# Patient Record
Sex: Male | Born: 1995 | Race: White | Hispanic: No | Marital: Single | Smoking: Never smoker
Health system: Southern US, Community
[De-identification: ages and names within clinical notes are randomized; demographics above are authoritative.]

## PROBLEM LIST (undated history)

## (undated) DIAGNOSIS — K900 Celiac disease: Secondary | ICD-10-CM

---
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
## 2013-03-25 ENCOUNTER — Emergency Department (HOSPITAL_COMMUNITY)
Admission: EM | Admit: 2013-03-25 | Discharge: 2013-03-26 | Disposition: A | Discharge status: Home / Self Care | Payer: BC Managed Care – PPO | Attending: Emergency Medicine | Admitting: Emergency Medicine

## 2013-03-25 ENCOUNTER — Encounter (HOSPITAL_COMMUNITY): Payer: Self-pay | Admitting: Emergency Medicine

## 2013-03-25 DIAGNOSIS — F341 Dysthymic disorder: Secondary | ICD-10-CM

## 2013-03-25 DIAGNOSIS — IMO0002 Reserved for concepts with insufficient information to code with codable children: Secondary | ICD-10-CM | POA: Diagnosis present

## 2013-03-25 DIAGNOSIS — F1920 Other psychoactive substance dependence, uncomplicated: Secondary | ICD-10-CM

## 2013-03-25 DIAGNOSIS — F3289 Other specified depressive episodes: Secondary | ICD-10-CM | POA: Insufficient documentation

## 2013-03-25 DIAGNOSIS — F1210 Cannabis abuse, uncomplicated: Secondary | ICD-10-CM | POA: Insufficient documentation

## 2013-03-25 DIAGNOSIS — R45851 Suicidal ideations: Secondary | ICD-10-CM

## 2013-03-25 DIAGNOSIS — F418 Other specified anxiety disorders: Secondary | ICD-10-CM | POA: Diagnosis present

## 2013-03-25 DIAGNOSIS — F411 Generalized anxiety disorder: Secondary | ICD-10-CM | POA: Insufficient documentation

## 2013-03-25 DIAGNOSIS — F329 Major depressive disorder, single episode, unspecified: Secondary | ICD-10-CM | POA: Insufficient documentation

## 2013-03-25 LAB — CBC
MCH: 32.8 pg (ref 26.0–34.0)
MCHC: 35.8 g/dL (ref 30.0–36.0)
Platelets: 250 10*3/uL (ref 150–400)
RBC: 5.16 MIL/uL (ref 4.22–5.81)
RDW: 12.9 % (ref 11.5–15.5)

## 2013-03-25 LAB — RAPID URINE DRUG SCREEN, HOSP PERFORMED
Amphetamines: NOT DETECTED
Barbiturates: NOT DETECTED
Cocaine: NOT DETECTED
Opiates: NOT DETECTED
Tetrahydrocannabinol: POSITIVE — AB

## 2013-03-25 LAB — COMPREHENSIVE METABOLIC PANEL
ALT: 16 U/L (ref 0–53)
AST: 18 U/L (ref 0–37)
Alkaline Phosphatase: 84 U/L (ref 39–117)
CO2: 24 mEq/L (ref 19–32)
Calcium: 9.9 mg/dL (ref 8.4–10.5)
GFR calc Af Amer: >90 mL/min (ref >90)
Glucose, Bld: 97 mg/dL (ref 70–99)
Potassium: 3.2 mEq/L — ABNORMAL LOW (ref 3.5–5.1)
Sodium: 136 mEq/L (ref 135–145)
Total Protein: 8 g/dL (ref 6.0–8.3)

## 2013-03-25 LAB — ETHANOL: Alcohol, Ethyl (B): <11 mg/dL (ref 0–11)

## 2013-03-25 MED ORDER — ONDANSETRON HCL 4 MG PO TABS: 4.0000 mg | ORAL_TABLET | Freq: Three times a day (TID) | ORAL | Status: DC | PRN

## 2013-03-25 MED ORDER — NICOTINE 21 MG/24HR TD PT24: 21.0000 mg | MEDICATED_PATCH | Freq: Every day | TRANSDERMAL | Status: DC

## 2013-03-25 MED ORDER — ZOLPIDEM TARTRATE 5 MG PO TABS: 5.0000 mg | ORAL_TABLET | Freq: Every evening | ORAL | Status: DC | PRN

## 2013-03-25 MED ORDER — LORAZEPAM 1 MG PO TABS: 1.0000 mg | ORAL_TABLET | Freq: Three times a day (TID) | ORAL | Status: DC | PRN

## 2013-03-25 MED ORDER — IBUPROFEN 200 MG PO TABS: 600.0000 mg | ORAL_TABLET | Freq: Three times a day (TID) | ORAL | Status: DC | PRN

## 2013-03-25 MED ORDER — LORAZEPAM 1 MG PO TABS
1.0000 mg | ORAL_TABLET | Freq: Once | ORAL | Status: AC
  Administered 2013-03-25: 1 mg via ORAL
  Filled 2013-03-25: qty 1

## 2013-03-25 MED ORDER — ALUM & MAG HYDROXIDE-SIMETH 200-200-20 MG/5ML PO SUSP: 30.0000 mL | ORAL | Status: DC | PRN

## 2013-03-25 NOTE — BH Assessment (Signed)
Assessment Note  Timothy Hale is an 17 y.o. male. Pt brought to [facility] under IVC taken out by parents.  Pt's wife left him yesterday and pt had made suicidal statements.  Pt denies SI, plan or intent currently and admits to saying yesterday that he didn't want to live anymore.  Pt reports depression since January 2014.  Stressors include the substance abuse related deaths of two of his friends in April 2014.  Pt downplayed any substance abuse issues, however pt's parents, with whom pt and his wife live, report significant substance abuse issues including marijuana, alcohol, and benzodiazepines.  Parents do express concern for pt's safety and report that last night pt said he had nothing to live for and "just wanted to die."  Pt's father is pt's employer and pt's substance abuse has significantly effected his work.  Pt denies HI/AV.  Pt has recently started therapy.  No prior psych admissions.  No substance abuse treatment.  Axis I: Substance Abuse Axis II: Deferred Axis III: History reviewed. No pertinent past medical history. Axis IV: occupational problems and problems with primary support group Axis V: 51-60 moderate symptoms  Past Medical History: History reviewed. No pertinent past medical history.  History reviewed. No pertinent past surgical history.  Family History: No family history on file.  Social History:  reports that he has never smoked. He does not have any smokeless tobacco history on file. He reports that he drinks alcohol. He reports that he does not use illicit drugs.  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  Parents report significantly more alcohol use. 1 - Last Use / Amount:  11/14, 2 shots Substance #2 Name of Substance 2: marijuana 2 - Age of First Use: 12 2 - Amount (size/oz): 1/8 oz 2 - Frequency: daily 2 - Duration: 5 years 2 - Last Use / Amount: 11/16.  Pt would not say how much he used today. Substance #3 Name of Substance 3: benzodiazepines 3 - Amount (size/oz): Pt did not disclose this to ACT.  Parents report significant abuse of xanax and klonapin.  CIWA: CIWA-Ar BP: 135/94 mmHg Pulse Rate: 95 COWS:    Allergies: No Known Allergies  Home Medications:  (Not in a hospital admission)  OB/GYN Status:  No LMP for male patient.  General Assessment Data Location of Assessment: [facility] ACT Assessment: Yes Is this a Tele or Face-to-Face Assessment?: Tele Assessment Is this an Initial Assessment or a Re-assessment for this encounter?: Initial Assessment Living Arrangements: Spouse/significant other;Parent Can pt return to current living arrangement?: Yes     [facility] Crisis Care Plan Living Arrangements: Spouse/significant other;Parent Name of Psychiatrist: none Name of Therapist: [facility]  Education Status Is patient currently in school?: No  Risk to self Suicidal Ideation: No-Not Currently/Within Last 6 Months Suicidal Intent: No Is patient at risk for suicide?:  (psychiatrist will determine tomorrow) Suicidal Plan?: No Access to Means: No What has been your use of drugs/alcohol within the last 12 months?: current significant use Previous Attempts/Gestures: No Intentional Self Injurious Behavior: None Family Suicide History: Yes (2 uncles) Recent stressful life event(s): Loss (Comment) (2 friends died 10/16/12, wife left him yesterday) Persecutory voices/beliefs?: No Depression:  Yes Depression Symptoms: Tearfulness;Fatigue;Guilt;Loss of interest in usual pleasures;Feeling angry/irritable Substance abuse history and/or treatment for substance abuse?: Yes Suicide prevention information given to non-admitted  patients: Not applicable  Risk to Others Homicidal Ideation: No Thoughts of Harm to Others: No Current Homicidal Intent: No Current Homicidal Plan: No Access to Homicidal Means: No History of harm to others?: No Assessment of Violence: None Noted Does patient have access to weapons?: No Criminal Charges Pending?: No Does patient have a court date: No  Psychosis Hallucinations: None noted Delusions: None noted  Mental Status Report Appear/Hygiene: Other (Comment) (casual) Eye Contact: Good Motor Activity: Unremarkable Speech: Logical/coherent Level of Consciousness: Alert Mood: Other (Comment) (pleasant) Affect: Appropriate to circumstance Anxiety Level: Minimal Thought Processes: Coherent;Relevant Judgement: Unimpaired Orientation: Person;Place;Time;Situation Obsessive Compulsive Thoughts/Behaviors: None  Cognitive Functioning Concentration: Normal Memory: Recent Intact;Remote Intact IQ: Average Insight: Good Impulse Control: Poor Appetite: Fair Weight Loss:  (yes-not sure how much) Weight Gain: 0 Sleep: No Change Total Hours of Sleep: 7 Vegetative Symptoms: None  ADLScreening [facility] Assessment Services) Patient's cognitive ability adequate to safely complete daily activities?: Yes Patient able to express need for assistance with ADLs?: Yes Independently performs ADLs?: Yes (appropriate for developmental age)  Prior Inpatient Therapy Prior Inpatient Therapy: No  Prior Outpatient Therapy Prior Outpatient Therapy: Yes Prior Therapy Dates: current Prior Therapy Facilty/Provider(s): [facility] Reason for Treatment: psych  ADL Screening (condition at time of admission) Patient's cognitive ability adequate to safely complete daily activities?: Yes Patient able to express need for assistance with ADLs?: Yes Independently performs ADLs?: Yes (appropriate for developmental age)       Abuse/Neglect Assessment (Assessment to be  complete while patient is alone) Physical Abuse: Denies Verbal Abuse: Denies Sexual Abuse: Denies Exploitation of patient/patient's resources: Denies Self-Neglect: Denies Values / Beliefs Cultural Requests During Hospitalization: None Spiritual Requests During Hospitalization: None   Advance Directives (For Healthcare) Advance Directive: Patient does not have advance directive;Patient would not like information    Additional Information 1:1 In Past 12 Months?: No CIRT Risk: No Elopement Risk: No Does patient have medical clearance?: Yes     Disposition: Discussed this pt with Maryjean Morn, PA, and plan is for pt to be evaluated by psychiatrist in AM to determine if IVC should be terminated.  ACT informed Dr. Radford Pax of [facility] of this plan and he will transfer pt to psych ed.  Disposition Initial Assessment Completed for this Encounter: Yes Disposition of Patient:  (psychiatrist will determine IVC)  On Site Evaluation by:   Reviewed with Physician:    Lorri Frederick 03/25/2013 9:04 PM

## 2013-03-25 NOTE — ED Notes (Signed)
Patient presents depressed, calm and cooperative; denies currents thoughts of self harm or thoughts to harm others; no AVH, no pain, NAD. Patient states that his wife told him that she had an affair with one of his groomsman; admits to depression and feelings of hopelessness.

## 2013-03-25 NOTE — Consult Note (Signed)
[facility] Face-to-Face Psychiatry Consult   Reason for Consult:  ED Referral on IVC by parents Referring Physician:  ED Providers Timothy Hale is an 17 y.o. male.  Assessment: AXIS I:  Substance Abuse ;THC Dependence; Depression with anxiety;Suicidal thought; AXIS II:  Deferred AXIS III:  History reviewed. No pertinent past medical history. AXIS IV:  problems with primary support group AXIS V:  41-50 serious symptoms  Plan:  Suicide risk assessment in AM by Psych MD  Subjective:   Timothy Hale is a 17 y.o. male patient admitted with IVC papers from parents who were concerned over pts drug abuse;poor work pwerformance (works with fathet) and seperation from wife.Pt expressed passive feelings of being better off dead and thoughts of suicide in the context of his depression and substance abuse without ideation.intent or plan.He has been under care of his FMD on Pristiq for 2 months and sees a counselor who has offerd him psychiatric referral..  HPI:  As above.Pt reports he ran out of xanax so he bought" 2- 5 mg Klonopin"his xanax rx is 1mg  # 30 for 30 days.He haqs been smoking marijuana heavily and daily for 3 years. HPI Elements:   Context:  As above.  Past Psychiatric History: History reviewed. No pertinent past medical history.  reports that he has never smoked. He does not have any smokeless tobacco history on file. He reports that he drinks alcohol. He reports that he does not use illicit drugs. No family history on file.       Abuse/Neglect [facility]) Physical Abuse: Denies Verbal Abuse: Denies Sexual Abuse: Denies Allergies:  No Known Allergies  ACT Assessment Complete:  Yes:    Educational Status    Risk to Self: Risk to self Suicidal Ideation: No-Not Currently/Within Last 6 Months Suicidal Intent: No Is patient at risk for suicide?:  (psychiatrist will determine tomorrow) Suicidal Plan?: No Access to Means: No What has been your use of drugs/alcohol within the last 12  months?: current significant use Previous Attempts/Gestures: No Intentional Self Injurious Behavior: None Family Suicide History: Yes (2 uncles) Recent stressful life event(s): Loss (Comment) (2 friends died Sep 17, 2012, wife left him yesterday) Persecutory voices/beliefs?: No Depression: Yes Depression Symptoms: Tearfulness;Fatigue;Guilt;Loss of interest in usual pleasures;Feeling angry/irritable Substance abuse history and/or treatment for substance abuse?: Yes Suicide prevention information given to non-admitted patients: Not applicable  Risk to Others: Risk to Others Homicidal Ideation: No Thoughts of Harm to Others: No Current Homicidal Intent: No Current Homicidal Plan: No Access to Homicidal Means: No History of harm to others?: No Assessment of Violence: None Noted Does patient have access to weapons?: No Criminal Charges Pending?: No Does patient have a court date: No  Abuse: Abuse/Neglect Assessment (Assessment to be complete while patient is alone) Physical Abuse: Denies Verbal Abuse: Denies Sexual Abuse: Denies Exploitation of patient/patient's resources: Denies Self-Neglect: Denies  Prior Inpatient Therapy: Prior Inpatient Therapy Prior Inpatient Therapy: No  Prior Outpatient Therapy: Prior Outpatient Therapy Prior Outpatient Therapy: Yes Prior Therapy Dates: current Prior Therapy Facilty/Provider(s): [facility] Reason for Treatment: psych  Additional Information: Additional Information 1:1 In Past 12 Months?: No CIRT Risk: No Elopement Risk: No Does patient have medical clearance?: Yes                  Objective: Blood pressure 135/94, pulse 95, temperature 98.4 F (36.9 C), temperature source Oral, resp. rate 16, SpO2 98.00%.There is no height or weight on file to calculate BMI. Results for orders placed during  the hospital encounter of 03/25/13 (from the past 72 hour(s))  URINE RAPID DRUG SCREEN (HOSP PERFORMED)      Status: Abnormal   Collection Time    03/25/13  7:27 PM      Result Value Range   Opiates NONE DETECTED  NONE DETECTED   Cocaine NONE DETECTED  NONE DETECTED   Benzodiazepines POSITIVE (*) NONE DETECTED   Amphetamines NONE DETECTED  NONE DETECTED   Tetrahydrocannabinol POSITIVE (*) NONE DETECTED   Barbiturates NONE DETECTED  NONE DETECTED   Comment:            DRUG SCREEN FOR MEDICAL PURPOSES     ONLY.  IF CONFIRMATION IS NEEDED     FOR ANY PURPOSE, NOTIFY LAB     WITHIN 5 DAYS.                LOWEST DETECTABLE LIMITS     FOR URINE DRUG SCREEN     Drug Class       Cutoff (ng/mL)     Amphetamine      1000     Barbiturate      200     Benzodiazepine   200     Tricyclics       300     Opiates          300     Cocaine          300     THC              50  ACETAMINOPHEN LEVEL     Status: None   Collection Time    03/25/13  7:53 PM      Result Value Range   Acetaminophen (Tylenol), Serum 15.0  10 - 30 ug/mL   Comment:            THERAPEUTIC CONCENTRATIONS VARY     SIGNIFICANTLY. A RANGE OF 10-30     ug/mL MAY BE AN EFFECTIVE     CONCENTRATION FOR MANY PATIENTS.     HOWEVER, SOME ARE BEST TREATED     AT CONCENTRATIONS OUTSIDE THIS     RANGE.     ACETAMINOPHEN CONCENTRATIONS     >150 ug/mL AT 4 HOURS AFTER     INGESTION AND >50 ug/mL AT 12     HOURS AFTER INGESTION ARE     OFTEN ASSOCIATED WITH TOXIC     REACTIONS.  CBC     Status: Abnormal   Collection Time    03/25/13  7:53 PM      Result Value Range   WBC 10.9 (*) 4.0 - 10.5 K/uL   RBC 5.16  4.22 - 5.81 MIL/uL   Hemoglobin 16.9  13.0 - 17.0 g/dL   HCT 16.1  09.6 - 04.5 %   MCV 91.5  78.0 - 100.0 fL   MCH 32.8  26.0 - 34.0 pg   MCHC 35.8  30.0 - 36.0 g/dL   RDW 40.9  81.1 - 91.4 %   Platelets 250  150 - 400 K/uL  COMPREHENSIVE METABOLIC PANEL     Status: Abnormal   Collection Time    03/25/13  7:53 PM      Result Value Range   Sodium 136  135 - 145 mEq/L   Potassium 3.2 (*) 3.5 - 5.1 mEq/L   Chloride 100  96  - 112 mEq/L   CO2 24  19 - 32 mEq/L   Glucose, Bld 97  70 - 99 mg/dL  BUN 15  6 - 23 mg/dL   Creatinine, Ser 1.61  0.50 - 1.35 mg/dL   Calcium 9.9  8.4 - 09.6 mg/dL   Total Protein 8.0  6.0 - 8.3 g/dL   Albumin 4.8  3.5 - 5.2 g/dL   AST 18  0 - 37 U/L   ALT 16  0 - 53 U/L   Alkaline Phosphatase 84  39 - 117 U/L   Total Bilirubin 0.4  0.3 - 1.2 mg/dL   GFR calc non Af Amer >90  >90 mL/min   GFR calc Af Amer >90  >90 mL/min   Comment: (NOTE)     The eGFR has been calculated using the CKD EPI equation.     This calculation has not been validated in all clinical situations.     eGFR's persistently <90 mL/min signify possible Chronic Kidney     Disease.  ETHANOL     Status: None   Collection Time    03/25/13  7:53 PM      Result Value Range   Alcohol, Ethyl (B) <11  0 - 11 mg/dL   Comment:            LOWEST DETECTABLE LIMIT FOR     SERUM ALCOHOL IS 11 mg/dL     FOR MEDICAL PURPOSES ONLY  SALICYLATE LEVEL     Status: Abnormal   Collection Time    03/25/13  7:53 PM      Result Value Range   Salicylate Lvl <2.0 (*) 2.8 - 20.0 mg/dL   Labs are reviewed and are pertinent for UDS + Benzo and THC..  Current Facility-Administered Medications  Medication Dose Route Frequency Provider Last Rate Last Dose  . LORazepam (ATIVAN) tablet 1 mg  1 mg Oral Once Roxy Horseman, PA-C       Current Outpatient Prescriptions  Medication Sig Dispense Refill  . ALPRAZolam (XANAX) 1 MG tablet Take 1 tablet by mouth daily.      Marland Kitchen PRISTIQ 50 MG 24 hr tablet Take 1 tablet by mouth daily.        Psychiatric Specialty Exam:     Blood pressure 135/94, pulse 95, temperature 98.4 F (36.9 C), temperature source Oral, resp. rate 16, SpO2 98.00%.There is no height or weight on file to calculate BMI.  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  without intent/plan fleeting and in the context of his substance abuse and problema at work and at home  Homicidal Thoughts:  No  Memory:  NA  Judgement:  Poor  Insight:  Lacking especially around his THC history  Psychomotor Activity:  Normal  Concentration:  Good  Recall:  Good  Akathisia:  NA  Handed:  Right  AIMS (if indicated):     Assets:  Social Support  Sleep:      Treatment Plan Summary: Pt to remain overnite for observation and undergo Suicide Risk Assessment  in AM. He plans to call his therapist for appt tomorrow and referrall to psychiatrist  Court Joy 03/25/2013 8:48 PM

## 2013-03-25 NOTE — ED Provider Notes (Signed)
Medical screening examination/treatment/procedure(s) were performed by non-physician practitioner and as supervising physician I was immediately available for consultation/collaboration.    Nelia Shi, MD 03/25/13 2051

## 2013-03-25 NOTE — ED Provider Notes (Signed)
CSN: [id number]     Arrival date & time 03/25/13  1831 History  This chart was scribed for non-physician practitioner, Roxy Horseman, PA-C,working with Candyce Churn, MD, by Karle Plumber, ED Scribe.  This patient was seen in room [facility] and the patient's care was started at 6:48 PM.  Chief Complaint  Patient presents with  . Psychiatric Evaluation   The history is provided by the patient. No language interpreter was used.   HPI Comments:  Timothy Hale is a 17 y.o. male brought in by [facility] Dept who presents to the Emergency Department complaining of IVC. Pt states he is not a threat to himself or others and does not have a desire to hurt himself or anyone else. He states he has been struggling with depression, but has no desire to hurt himself because he is a [place] and believes he will go to [place] if he commits suicide. He states his two best friends died in 2022-09-14, his wife cheated on him and he has several issues as such that he has not been able to deal with properly. He states he has been alone for sometime with no outlet. He states his antidepressants have been affecting him sexually, making his wife feel like he does not find her attractive or love her anymore. Pt states his medication was changed to Prestique, which helped some, but he still experiences the lack of sex drive. He states he has a hard time performing simple tasks at work causing problems with his job. He expresses a desire to have his medications changed so that he can get some sort of relief from the depression. He denies any knowledge of any medical problems. He denies any pain. Pt states he feels somewhat anxious right now because he does not know what is going to happen. He reports recent use of marijuana.   History reviewed. No pertinent past medical history. History reviewed. No pertinent past surgical history. No family history on file. History  Substance Use Topics  . Smoking  status: Never Smoker   . Smokeless tobacco: Not on file  . Alcohol Use: Yes    Review of Systems  Allergies  Review of patient's allergies indicates not on file.  Home Medications  No current outpatient prescriptions on file. BP 135/94  Pulse 125  Temp(Src) 98.4 F (36.9 C) (Oral)  Resp 16  SpO2 98% Physical Exam  Nursing note and vitals reviewed. Constitutional: He is oriented to person, place, and time. He appears well-developed and well-nourished. No distress.  HENT:  Head: Normocephalic and atraumatic.  Eyes: Conjunctivae are normal. No scleral icterus.  Neck: Neck supple.  Cardiovascular: Regular rhythm, normal heart sounds and intact distal pulses.   Tachycardic  Pulmonary/Chest: Effort normal and breath sounds normal. No stridor. No respiratory distress. He has no wheezes. He has no rales.  Abdominal: Normal appearance. He exhibits no distension.  Musculoskeletal: Normal range of motion.  Neurological: He is alert and oriented to person, place, and time.  Skin: Skin is warm and dry. No rash noted.  Psychiatric: He has a normal mood and affect. His behavior is normal.    ED Course  Procedures (including critical care time) DIAGNOSTIC STUDIES: Oxygen Saturation is 98% on RA, normal by my interpretation.   COORDINATION OF CARE: 7:00 PM- Will have psych and social work evaluate pt as necessary. Pt verbalizes understanding and agrees to plan.  Medications  LORazepam (ATIVAN) tablet 1 mg (not administered)    Labs Review  Labs Reviewed  ACETAMINOPHEN LEVEL  CBC  COMPREHENSIVE METABOLIC PANEL  ETHANOL  SALICYLATE LEVEL  URINE RAPID DRUG SCREEN (HOSP PERFORMED)   Imaging Review No results found.  EKG Interpretation   None       MDM  No diagnosis found.  With patient under IVC. Severe depression, many life stressors. Will discuss the patient with TTS.  Psych hold orders.  Patient discussed with Dr. Unknown Foley, will give a dose of Ativan, and recheck  heart rate. If HR remains elevated, will keep the patient on the acute side until stabilized.  7:32 PM Filed Vitals:   03/25/13 1931  BP:   Pulse: 95  Temp:   Resp:      Plan:   -TTS consult pending -Labs pending  Patient signed out to Dr. Radford Pax and Tiburcio Pea, PA-C, who will follow-up on labs and continue care.  I personally performed the services described in this documentation, which was scribed in my presence. The recorded information has been reviewed and is accurate.     Roxy Horseman, PA-C 03/25/13 1944

## 2013-03-25 NOTE — ED Notes (Signed)
Pt family took out IVC papers sts that pt wanted to harm himself.

## 2013-03-25 NOTE — ED Notes (Signed)
Psych assessment completed.  Report called to psych holding.  Pt to transfer to holding once paperwork completed.

## 2013-03-26 NOTE — ED Notes (Signed)
Pt is awake and alert, pleasant and cooperative. Patient denies HI, SI AH or VH. Discharge vitals 127/79 HR 88 RR 16 and unlabored.. Will continue to monitor for safety. Patient escorted to lobby without incident. T.Melvyn Neth RN

## 2013-03-26 NOTE — [facility] Suicide Risk Assessment (Signed)
Suicide Risk Assessment  Discharge Assessment     Demographic Factors:  Male and Adolescent or young adult  Mental Status Per Nursing Assessment::   On Admission:     Current Mental Status by Physician: Psychiatric Specialty Exam: Physical Exam  ROS  Blood pressure 119/66, pulse 66, temperature 97.8 F (36.6 C), temperature source Oral, resp. rate 16, SpO2 98.00%.There is no height or weight on file to calculate BMI.  General Appearance: Fairly Groomed  Patent attorney::  Fair  Speech:  Clear and Coherent  Volume:  Normal  Mood:  Depressed  Affect:  Congruent  Thought Process:  Goal Directed  Orientation:  Full (Time, Place, and Person)  Thought Content:  Hallucinations: None  Suicidal Thoughts:  No  Homicidal Thoughts:  No  Memory:  Negative  Judgement:  Fair  Insight:  Fair  Psychomotor Activity:  Normal  Concentration:  Good  Recall:  Good  Akathisia:  Negative  Handed:  Right  AIMS (if indicated):     Assets:  Communication Skills Desire for Improvement Housing Social Support  Sleep:       Loss Factors: Loss of significant relationship  Historical Factors: NA  Risk Reduction Factors:   Sense of responsibility to family, Religious beliefs about death, Employed, Living with another person, especially a relative and Positive social support  Continued Clinical Symptoms:  depression  Cognitive Features That Contribute To Risk:  Polarized thinking    Suicide Risk:  Minimal: No identifiable suicidal ideation.  Patients presenting with no risk factors but with morbid ruminations; may be classified as minimal risk based on the severity of the depressive symptoms  Discharge Diagnoses:   AXIS I:  Depressive Disorder NOS AXIS II:  Deferred AXIS III:  History reviewed. No pertinent past medical history. AXIS IV:  other psychosocial or environmental problems AXIS V:  61-70 mild symptoms  Plan Of Care/Follow-up recommendations:  Other:  f/u with oupatient  psychiatrist and therapist  Is patient on multiple antipsychotic therapies at discharge:  No   Has Patient had three or more failed trials of antipsychotic monotherapy by history:  No  Recommended Plan for Multiple Antipsychotic Therapies: NA  Louie Meaders 03/26/2013, 2:53 PM

## 2013-03-26 NOTE — ED Provider Notes (Signed)
Pt seen by myself and psych and cleared for d/c.   Toy Baker, MD 03/26/13 [id number]

## 2013-03-26 NOTE — Progress Notes (Signed)
Patient ID: Timothy Hale, male   DOB: Sep 23, 1995, 17 y.o.   MRN: [id number]  Pt was seen with NP. Initial consult note reviewed, and med records reviewed. Pt reports being depressed d/t wife cheating on him. He states that he would never hurt himself, because "I am Christian, and I would not want to go to hell by killing myself". He denies HI/AVH. Sleep/appt good. He drinks 3 beers once a week, and uses marijuana daily. He started taking pristiq 50 mg daily 6 weeks ago, and has sexual side effects. Pt denies current SI/HI/AVH.  See suicide risk assessment for [facility] on discharge.  Plan: I called dad for collateral info. Dad agrees to lock up all pills at home. Pt does not have access to weapons or firearms. Dad denies that pt has any h/o suicide attempts. Pt lives with dad. Pt had passive death wishes yesterday ("nothing to live for"), but did not have plan/intent to hurt himself. Dad feels comfortable with pt being discharged to home today. Dad feels that pt "learned his lesson". Dad will make sure that pt keeps f/u appointments. Will discharge pt to home today, and f/u with outpatient psychiatrist/therapist. Recommended substance abuse counseling as well.  IVC was released.  Ancil Linsey, MD

## 2013-03-29 NOTE — Consult Note (Signed)
Agree with plan 

## 2017-07-28 ENCOUNTER — Emergency Department (HOSPITAL_BASED_OUTPATIENT_CLINIC_OR_DEPARTMENT_OTHER)
Admission: EM | Admit: 2017-07-28 | Discharge: 2017-07-28 | Disposition: A | Discharge status: Home / Self Care | Payer: BLUE CROSS/BLUE SHIELD | Attending: Emergency Medicine | Admitting: Emergency Medicine

## 2017-07-28 ENCOUNTER — Emergency Department (HOSPITAL_BASED_OUTPATIENT_CLINIC_OR_DEPARTMENT_OTHER): Payer: BLUE CROSS/BLUE SHIELD

## 2017-07-28 ENCOUNTER — Encounter (HOSPITAL_BASED_OUTPATIENT_CLINIC_OR_DEPARTMENT_OTHER): Payer: Self-pay

## 2017-07-28 DIAGNOSIS — Z789 Other specified health status: Secondary | ICD-10-CM

## 2017-07-28 DIAGNOSIS — I10 Essential (primary) hypertension: Secondary | ICD-10-CM | POA: Diagnosis not present

## 2017-07-28 DIAGNOSIS — Z79899 Other long term (current) drug therapy: Secondary | ICD-10-CM | POA: Insufficient documentation

## 2017-07-28 DIAGNOSIS — Z720 Tobacco use: Secondary | ICD-10-CM | POA: Insufficient documentation

## 2017-07-28 DIAGNOSIS — K760 Fatty (change of) liver, not elsewhere classified: Secondary | ICD-10-CM | POA: Diagnosis present

## 2017-07-28 DIAGNOSIS — R197 Diarrhea, unspecified: Secondary | ICD-10-CM | POA: Insufficient documentation

## 2017-07-28 DIAGNOSIS — F1010 Alcohol abuse, uncomplicated: Secondary | ICD-10-CM | POA: Insufficient documentation

## 2017-07-28 DIAGNOSIS — R112 Nausea with vomiting, unspecified: Secondary | ICD-10-CM | POA: Insufficient documentation

## 2017-07-28 DIAGNOSIS — Z7289 Other problems related to lifestyle: Secondary | ICD-10-CM

## 2017-07-28 HISTORY — DX: Celiac disease: K90.0

## 2017-07-28 LAB — COMPREHENSIVE METABOLIC PANEL
ALT: 55 U/L (ref 17–63)
AST: 40 U/L (ref 15–41)
Albumin: 4.8 g/dL (ref 3.5–5.0)
Alkaline Phosphatase: 83 U/L (ref 38–126)
Anion gap: 12 (ref 5–15)
BILIRUBIN TOTAL: 0.5 mg/dL (ref 0.3–1.2)
BUN: 16 mg/dL (ref 6–20)
CO2: 21 mmol/L — ABNORMAL LOW (ref 22–32)
CREATININE: 0.9 mg/dL (ref 0.61–1.24)
Calcium: 9.5 mg/dL (ref 8.9–10.3)
Chloride: 105 mmol/L (ref 101–111)
Glucose, Bld: 110 mg/dL — ABNORMAL HIGH (ref 65–99)
POTASSIUM: 4 mmol/L (ref 3.5–5.1)
Sodium: 138 mmol/L (ref 135–145)
TOTAL PROTEIN: 7.7 g/dL (ref 6.5–8.1)

## 2017-07-28 LAB — CBC WITH DIFFERENTIAL/PLATELET
BASOS ABS: 0 10*3/uL (ref 0.0–0.1)
BASOS PCT: 0 %
EOS PCT: 1 %
Eosinophils Absolute: 0.1 10*3/uL (ref 0.0–0.7)
HCT: 46 % (ref 39.0–52.0)
Hemoglobin: 16.4 g/dL (ref 13.0–17.0)
Lymphocytes Relative: 18 %
Lymphs Abs: 1.3 10*3/uL (ref 0.7–4.0)
MCH: 33.9 pg (ref 26.0–34.0)
MCHC: 35.7 g/dL (ref 30.0–36.0)
MCV: 95 fL (ref 78.0–100.0)
Monocytes Absolute: 0.8 10*3/uL (ref 0.1–1.0)
Monocytes Relative: 10 %
Neutro Abs: 5.3 10*3/uL (ref 1.7–7.7)
Neutrophils Relative %: 71 %
PLATELETS: 220 10*3/uL (ref 150–400)
RBC: 4.84 MIL/uL (ref 4.22–5.81)
RDW: 12.7 % (ref 11.5–15.5)
WBC: 7.5 10*3/uL (ref 4.0–10.5)

## 2017-07-28 LAB — URINALYSIS, ROUTINE W REFLEX MICROSCOPIC
Bilirubin Urine: NEGATIVE
Glucose, UA: NEGATIVE mg/dL
Hgb urine dipstick: NEGATIVE
KETONES UR: 15 mg/dL — AB
Leukocytes, UA: NEGATIVE
NITRITE: NEGATIVE
PROTEIN: NEGATIVE mg/dL
Specific Gravity, Urine: 1.015 (ref 1.005–1.030)
pH: 7 (ref 5.0–8.0)

## 2017-07-28 LAB — LIPASE, BLOOD: LIPASE: 31 U/L (ref 11–51)

## 2017-07-28 MED ORDER — ONDANSETRON 4 MG PO TBDP: 4.0000 mg | ORAL_TABLET | Freq: Three times a day (TID) | ORAL | 0 refills | Status: AC | PRN

## 2017-07-28 MED ORDER — ONDANSETRON HCL 4 MG/2ML IJ SOLN
4.0000 mg | Freq: Once | INTRAMUSCULAR | Status: AC
Start: 2017-07-28 — End: 2017-07-28
  Administered 2017-07-28: 4 mg via INTRAVENOUS
  Filled 2017-07-28: qty 2

## 2017-07-28 MED ORDER — IOPAMIDOL (ISOVUE-300) INJECTION 61%
100.0000 mL | Freq: Once | INTRAVENOUS | Status: AC | PRN
  Administered 2017-07-28: 100 mL via INTRAVENOUS

## 2017-07-28 MED ORDER — SODIUM CHLORIDE 0.9 % IV BOLUS (SEPSIS)
1000.0000 mL | Freq: Once | INTRAVENOUS | Status: AC
  Administered 2017-07-28: 1000 mL via INTRAVENOUS

## 2017-07-28 NOTE — Discharge Instructions (Addendum)
Please avoid tylenol as this can harm your liver.  You need to slowly cut back on the amount of alcohol you drink.  Your liver changes are most likely representing an early sign of alcohol damage to your body.    Please stop smoking.  Your blood pressure was high today and you need to get it re-checked in 3 weeks.

## 2017-07-28 NOTE — ED Triage Notes (Addendum)
Pt reports emesis with blood starting this morning; has vomited 4 times this morning with blood in it twice; states he has lower abd pain. Hx of celiac disease. Denies any blood in BM this morning.

## 2017-07-28 NOTE — ED Notes (Signed)
Family at bedside. 

## 2017-07-28 NOTE — ED Provider Notes (Signed)
Patient vomited 4 times onset this morning.  He reports first episode had slight amount of blood in it second 2 episodes were yellowish material mixed with blood and the fourth episode was blood-tinged.  He also had 3 episodes of nonbloody diarrhea this morning.  He drinks 7-10 shots of liquor per day.  He is presently asymptomatic except for mild abdominal discomfort.  On exam alert no distress lungs clear to auscultation heart regular rate and rhythm abdomen nondistended nontender.  Neurologic Glasgow Coma Score 15 not tremulous cranial nerves II through XII grossly intact moves all extremities well.  Not lightheaded on standing.  I had a lengthy conversation with patient about his need to stop drinking given fatty liver on CT scan.  He will be referred to resource guide for substance abuse, I counseled patient for 5 minutes on smoking cessation he is advised to get his blood pressure rechecked in 3 weeks   Timothy Hale, Timothy Yohe, MD 07/28/17 1253

## 2017-07-28 NOTE — ED Provider Notes (Signed)
[facility] EMERGENCY DEPARTMENT Provider Note   CSN: [id number] Arrival date & time: 07/28/17  0906     History   Chief Complaint Chief Complaint  Patient presents with  . Emesis    HPI Timothy Hale is a 22 y.o. male with a history of celiac disease who presents today for evaluation of N/V/D and lower abdominal pain.  He reports that his pain and symptoms started around 4am.  No one else in the house has similar symptoms.  The reports that he has vomited twice this morning with multiple episodes of diarrhea.  Twice when he vomited it was read consistent with blood.  He denies any dark tarry sticky bowel movements.  He does report that he drinks about 10 shots a day.  He denies any problems when he stops drinking.  He denies excessive use of Tylenol, NSAIDs.  He reports that when he was about 17 he had a endoscopy where he had a 1.5 cm noncancerous mass removed.  He was supposed to have GI follow up in 3 years with repeat study but has not followed up with GI.    HPI  Past Medical History:  Diagnosis Date  . Celiac disease     Patient Active Problem List   Diagnosis Date Noted  . Fatty liver 07/28/2017  . Substance abuse/dependence 03/25/2013  . 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 Suicidal thoughts 03/25/2013    History reviewed. No pertinent surgical history.     Home Medications    Prior to Admission medications   Medication Sig Start Date End Date Taking? Authorizing Provider  ALPRAZolam Prudy Feeler) 1 MG tablet Take 1 tablet by mouth daily. 02/19/13   [provider]  ondansetron (ZOFRAN ODT) 4 MG disintegrating tablet Take 1 tablet (4 mg total) by mouth every 8 (eight) hours as needed for nausea or vomiting. 07/28/17   Cristina Gong, PA-C  PRISTIQ 50 MG 24 hr tablet Take 1 tablet by mouth daily. 02/28/13   [provider]    Family History No family history on file.  Social History Social History   Tobacco Use  . Smoking status:  Never Smoker  Substance Use Topics  . Alcohol use: Yes  . Drug use: No     Allergies   Gluten meal   Review of Systems Review of Systems  Constitutional: Negative for chills and fever.  Respiratory: Negative for cough and shortness of breath.   Gastrointestinal: Positive for abdominal pain, diarrhea, nausea and vomiting. Negative for abdominal distention and blood in stool.  Genitourinary: Negative for dysuria.  Musculoskeletal: Negative for neck pain and neck stiffness.  Skin: Negative for rash.  Neurological: Negative for dizziness and light-headedness.  All other systems reviewed and are negative.    Physical Exam Updated Vital Signs BP (!) 144/77 (BP Location: Left Arm)   Pulse 79   Temp 98.6 F (37 C) (Oral)   Resp 18   Ht 6\' 2"  (1.88 m)   Wt 89.8 kg (198 lb)   SpO2 100%   BMI 25.42 kg/m   Physical Exam  Constitutional: He is oriented to person, place, and time. He appears well-developed and well-nourished.  HENT:  Head: Normocephalic and atraumatic.  Mouth/Throat: Oropharynx is clear and moist.  Eyes: Conjunctivae are normal.  Neck: Normal range of motion. Neck supple.  Cardiovascular: Normal rate and regular rhythm.  No murmur heard. Pulmonary/Chest: Effort normal and breath sounds normal. No respiratory distress.  Abdominal: Soft. Normal appearance. He exhibits no  distension and no mass. Bowel sounds are increased. There is tenderness (diffusely). There is no guarding.  Musculoskeletal: He exhibits no edema.  Neurological: He is alert and oriented to person, place, and time.  Skin: Skin is warm and dry. He is not diaphoretic.  Psychiatric: He has a normal mood and affect.  Nursing note and vitals reviewed.    ED Treatments / Results  Labs (all labs ordered are listed, but only abnormal results are displayed) Labs Reviewed  COMPREHENSIVE METABOLIC PANEL - Abnormal; Notable for the following components:      Result Value   CO2 21 (*)    Glucose,  Bld 110 (*)    All other components within normal limits  URINALYSIS, ROUTINE W REFLEX MICROSCOPIC - Abnormal; Notable for the following components:   Ketones, ur 15 (*)    All other components within normal limits  LIPASE, BLOOD  CBC WITH DIFFERENTIAL/PLATELET    EKG  EKG Interpretation None       Radiology Ct Abdomen Pelvis W Contrast  Result Date: 07/28/2017 CLINICAL DATA:  Lower abdominal pain with nausea and vomiting EXAM: CT ABDOMEN AND PELVIS WITH CONTRAST TECHNIQUE: Multidetector CT imaging of the abdomen and pelvis was performed using the standard protocol following bolus administration of intravenous contrast. CONTRAST:  100mL ISOVUE-300 COMPARISON:  None. FINDINGS: Lower chest: No acute abnormality. Hepatobiliary: Diffuse fatty infiltration of the liver is noted. The gallbladder is decompressed. Pancreas: Unremarkable. No pancreatic ductal dilatation or surrounding inflammatory changes. Spleen: Normal in size without focal abnormality. Adrenals/Urinary Tract: Adrenal glands are unremarkable. Kidneys are normal, without renal calculi, focal lesion, or hydronephrosis. Bladder is unremarkable. Stomach/Bowel: Stomach is within normal limits. Appendix appears normal. No evidence of bowel wall thickening, distention, or inflammatory changes. Vascular/Lymphatic: No significant vascular findings are present. No enlarged abdominal or pelvic lymph nodes. Reproductive: Prostate is unremarkable. Other: No abdominal wall hernia or abnormality. No abdominopelvic ascites. Musculoskeletal: No acute or significant osseous findings. IMPRESSION: Fatty liver. No acute abnormality is identified. Electronically Signed   By: Alcide CleverMark  Lukens M.D.   On: 07/28/2017 12:11    Procedures Procedures (including critical care time)  Medications Ordered in ED Medications  sodium chloride 0.9 % bolus 1,000 mL (0 mLs Intravenous Stopped 07/28/17 1204)  iopamidol (ISOVUE-300) 61 % injection 100 mL (100 mLs  Intravenous Contrast Given 07/28/17 1126)  ondansetron (ZOFRAN) injection 4 mg (4 mg Intravenous Given 07/28/17 1312)     Initial Impression / Assessment and Plan / ED Course  I have reviewed the triage vital signs and the nursing notes.  Pertinent labs & imaging results that were available during my care of the patient were reviewed by me and considered in my medical decision making (see chart for details).     Patient with symptoms consistent with viral gastroenteritis.  Vitals are stable, no fever.  No signs of dehydration, tolerating PO fluids > 6 oz.  Lungs are clear.  No focal abdominal pain.  Given history of drinking and having a 1.5cm mass removed and not following up with GI labs were obtained and reviewed, appear reassuring.  CT abdomen pelvis was obtained given history of mass, and vomit with blood.  CT scan showed fatty liver with out cause for his symptoms.  We discussed the need to stop drinking and that he needs to slowly wean him self down as I suspect he may go into withdrawal if he stopped cold [place].  GI referral given along with outpatient substance abuse guide.  Supportive therapy indicated with return if symptoms worsen.  Patient counseled.   Final Clinical Impressions(s) / ED Diagnoses   Final diagnoses:  Nausea vomiting and diarrhea  Fatty liver  Hypertension, unspecified type  Alcohol use  Tobacco use    ED Discharge Orders        Ordered    ondansetron (ZOFRAN ODT) 4 MG disintegrating tablet  Every 8 hours PRN     07/28/17 1358       Norman Clay 07/28/17 2140    Doug Sou, MD 07/29/17 [phone]

## 2017-07-28 NOTE — ED Notes (Signed)
Patient transported to CT 

## 2019-05-20 IMAGING — CT CT ABD-PELV W/ CM
2 of 4 series · 16 of 46 positions shown, 18 images · IV contrast (agent unspecified)
Comparison: None.

CLINICAL DATA: Lower abdominal pain with nausea and vomiting

EXAM:
CT ABDOMEN AND PELVIS WITH CONTRAST
TECHNIQUE: Multidetector CT imaging of the abdomen and pelvis was performed
using the standard protocol following bolus administration of
intravenous contrast.
CONTRAST:  100mL [id number]

[Series 2: axial st · axial · 0.89mm/px · z∈[-544,-68]mm · 13 of 105 slices shown, 15 images]
[im 5/105  soft-tissue]
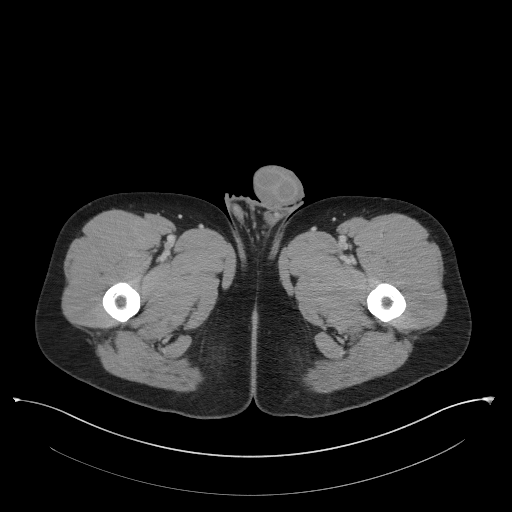
[im 5/105  bone]
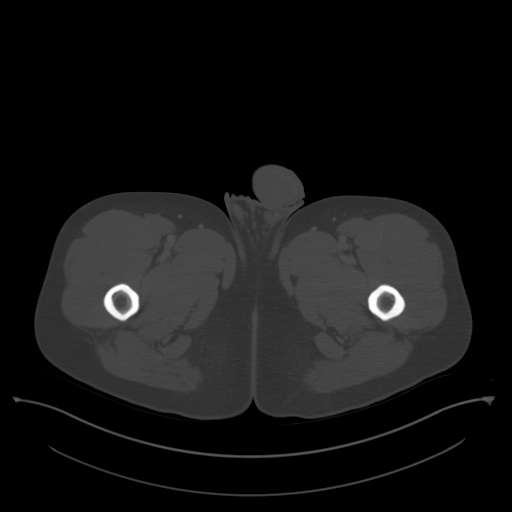
[im 14/105  soft-tissue]
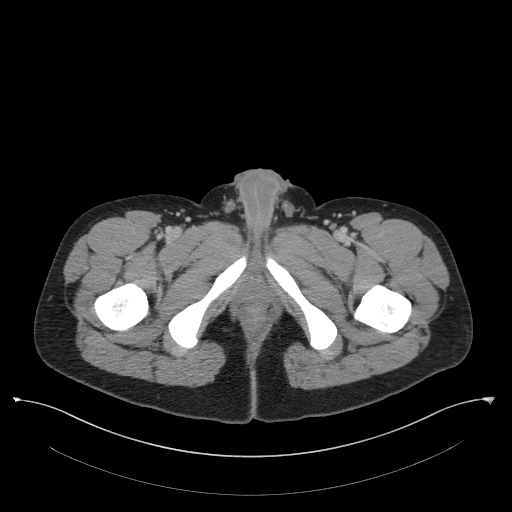
[im 22/105  soft-tissue]
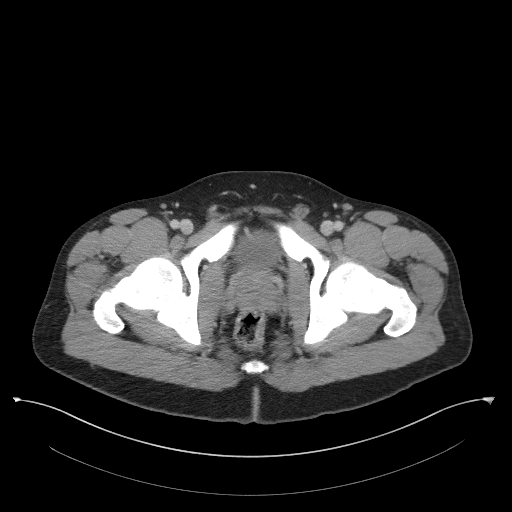
[im 31/105  soft-tissue]
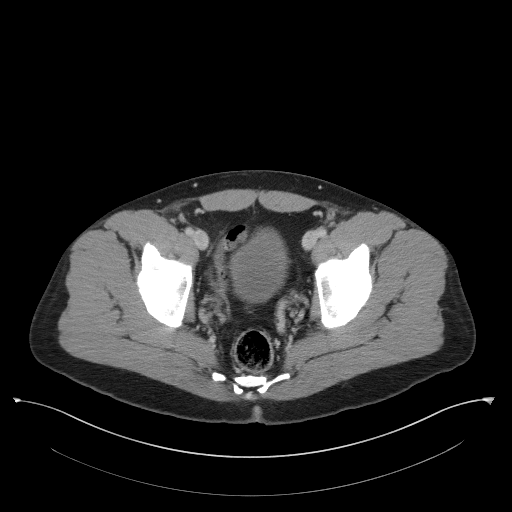
[im 35/105  soft-tissue]
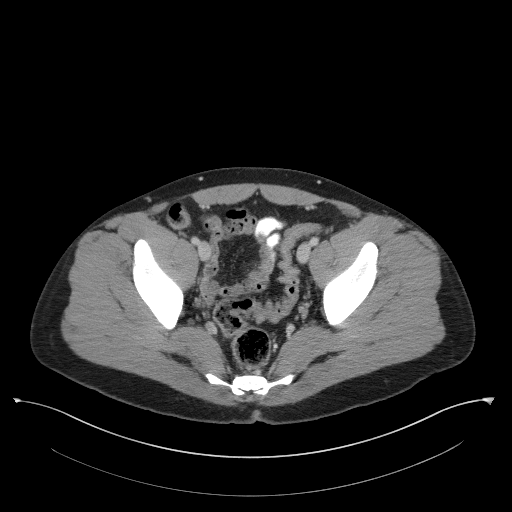
[im 44/105  soft-tissue]
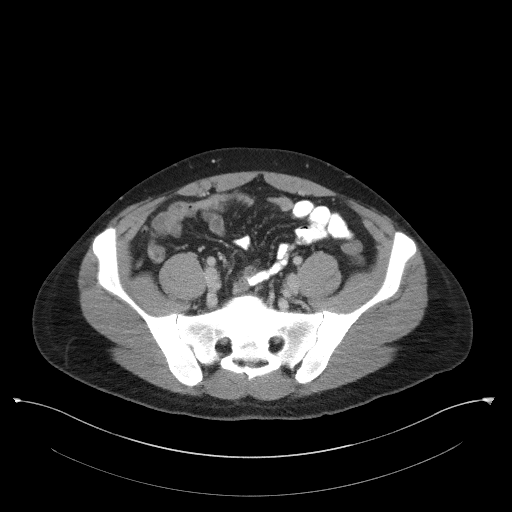
[im 53/105  soft-tissue]
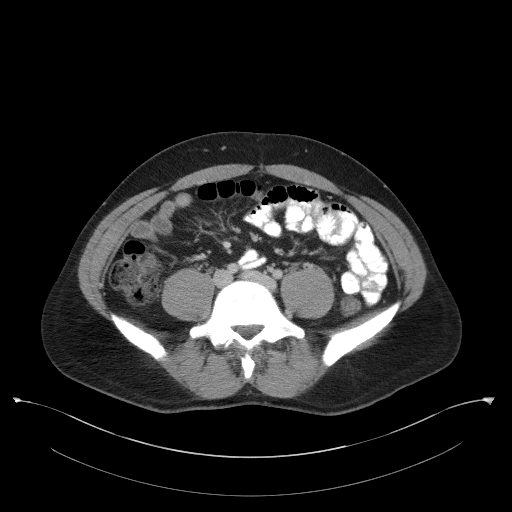
[im 61/105  soft-tissue]
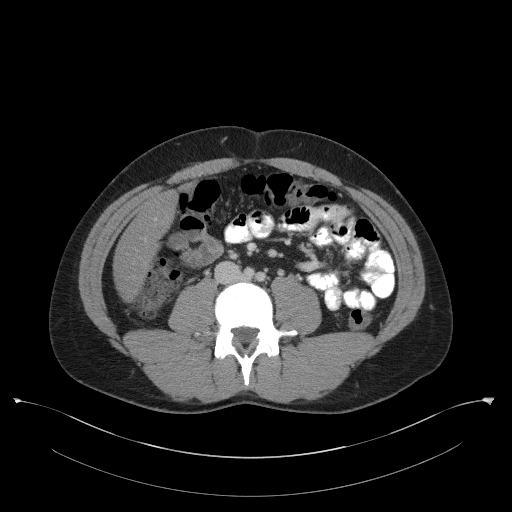
[im 70/105  soft-tissue]
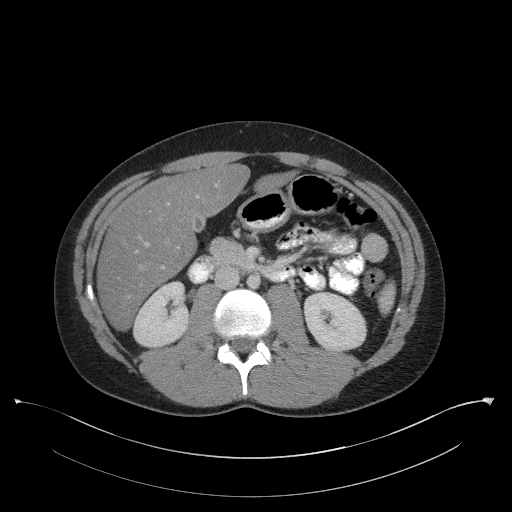
[im 70/105  bone]
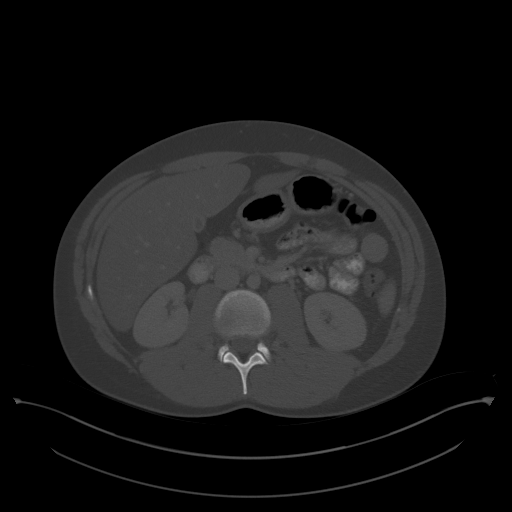
[im 74/105  soft-tissue]
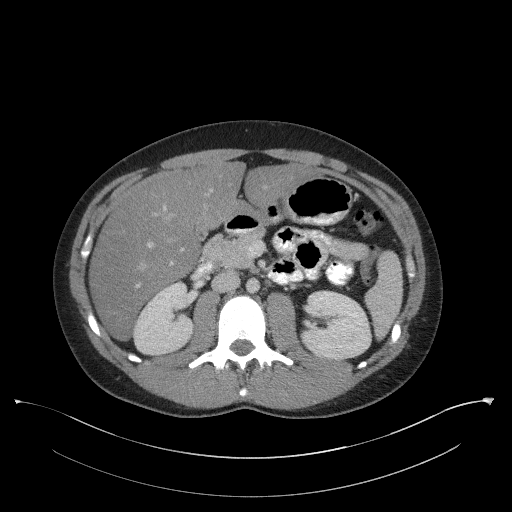
[im 83/105  soft-tissue]
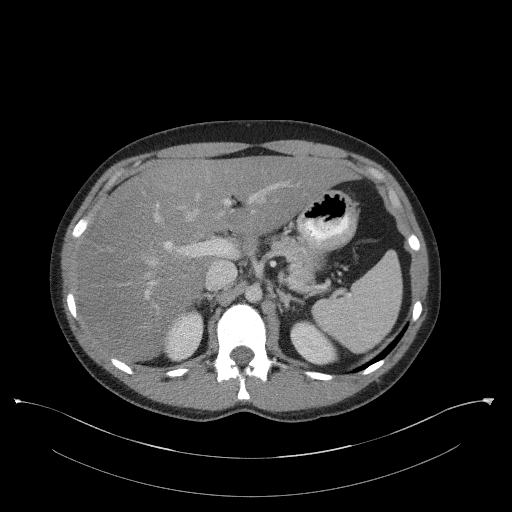
[im 92/105  soft-tissue]
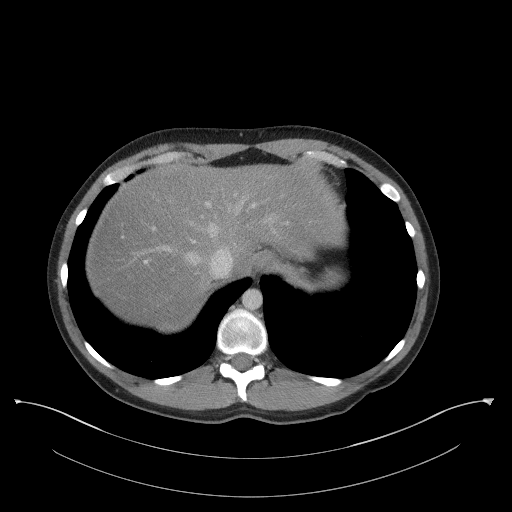
[im 100/105  soft-tissue]
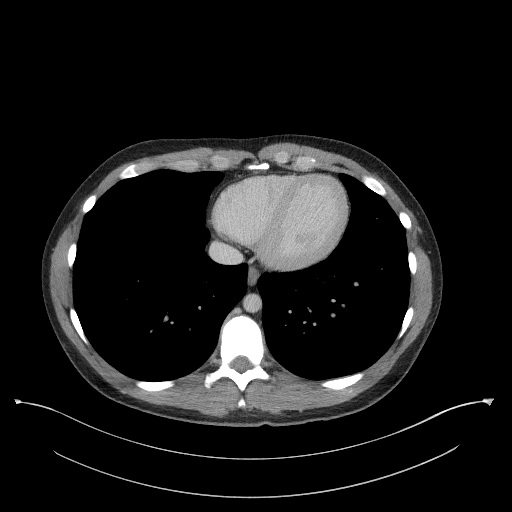

[Series 5: coronal st · coronal · 0.78mm/px · 3 of 92 slices shown]
[im 31/92  soft-tissue]
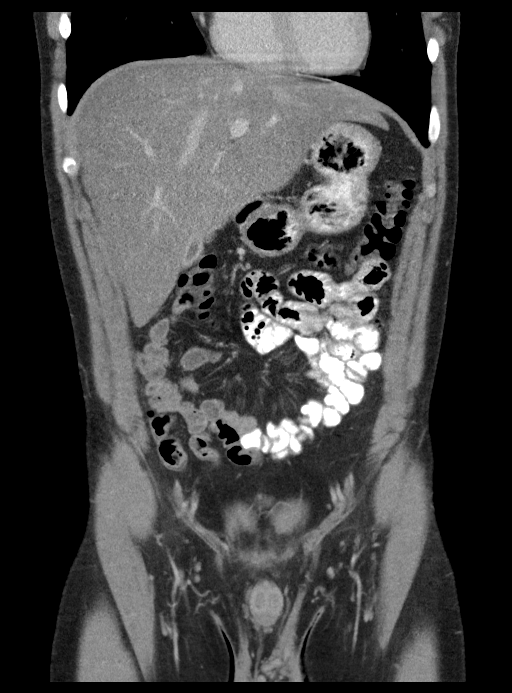
[im 41/92  soft-tissue]
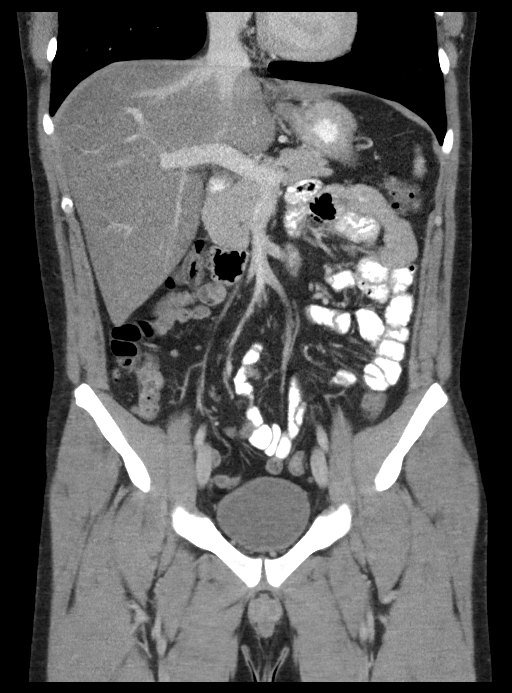
[im 51/92  soft-tissue]
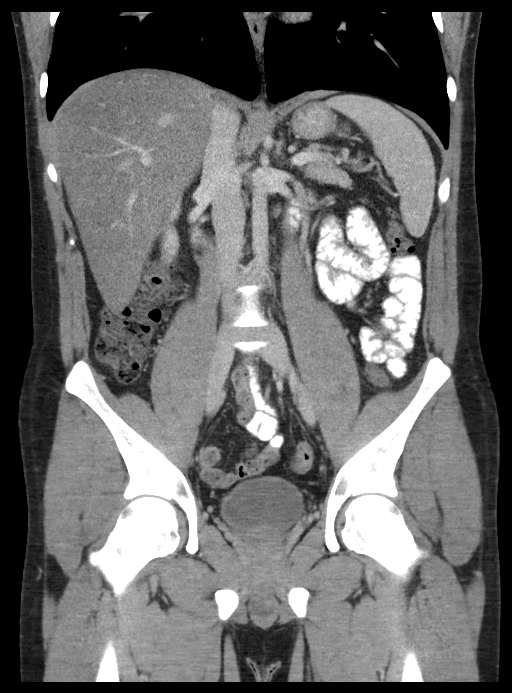

[16 of 46 positions shown; findings below may reference images not displayed]

FINDINGS: Lower chest: No acute abnormality.

Hepatobiliary: Diffuse fatty infiltration of the liver is noted. The
gallbladder is decompressed.

Pancreas: Unremarkable. No pancreatic ductal dilatation or
surrounding inflammatory changes.

Spleen: Normal in size without focal abnormality.

Adrenals/Urinary Tract: Adrenal glands are unremarkable. Kidneys are
normal, without renal calculi, focal lesion, or hydronephrosis.
Bladder is unremarkable.

Stomach/Bowel: Stomach is within normal limits. Appendix appears
normal. No evidence of bowel wall thickening, distention, or
inflammatory changes.

Vascular/Lymphatic: No significant vascular findings are present. No
enlarged abdominal or pelvic lymph nodes.

Reproductive: Prostate is unremarkable.

Other: No abdominal wall hernia or abnormality. No abdominopelvic
ascites.

Musculoskeletal: No acute or significant osseous findings.
IMPRESSION: Fatty liver.

No acute abnormality is identified.
